# Patient Record
Sex: Female | Born: 1950
Health system: Southern US, Community
[De-identification: ages and names within clinical notes are randomized; demographics above are authoritative.]

---

## 2003-07-02 ENCOUNTER — Other Ambulatory Visit: Admission: RE | Admit: 2003-07-02 | Discharge: 2003-07-02 | Payer: Self-pay | Admitting: Obstetrics and Gynecology

## 2008-04-07 ENCOUNTER — Encounter: Admission: RE | Admit: 2008-04-07 | Discharge: 2008-04-07 | Payer: Self-pay | Admitting: Family Medicine

## 2009-06-15 IMAGING — CR DG LUMBAR SPINE COMPLETE 4+V
5 series · 5 of 5 positions shown · non-contrast
Comparison: None

CLINICAL DATA: Low back pain.  Left leg pain.

LUMBAR SPINE - COMPLETE 4+ VIEW

[view not recorded (1 of 5)]
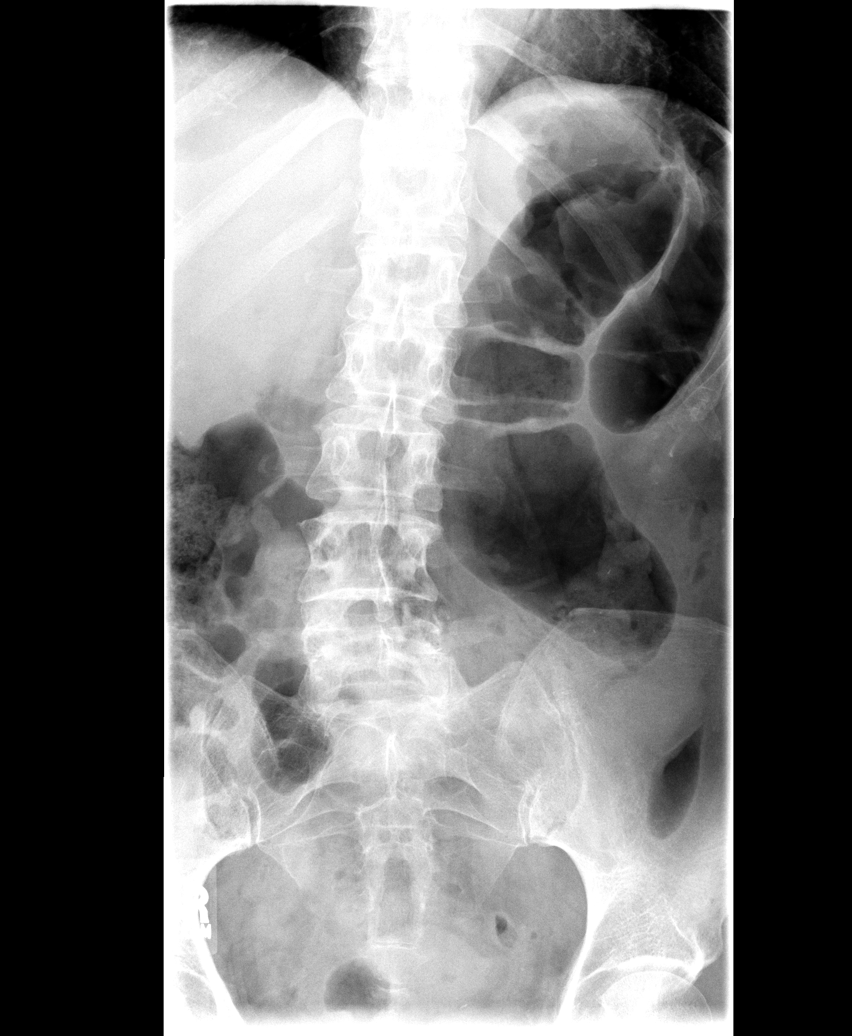

[view not recorded (2 of 5)]
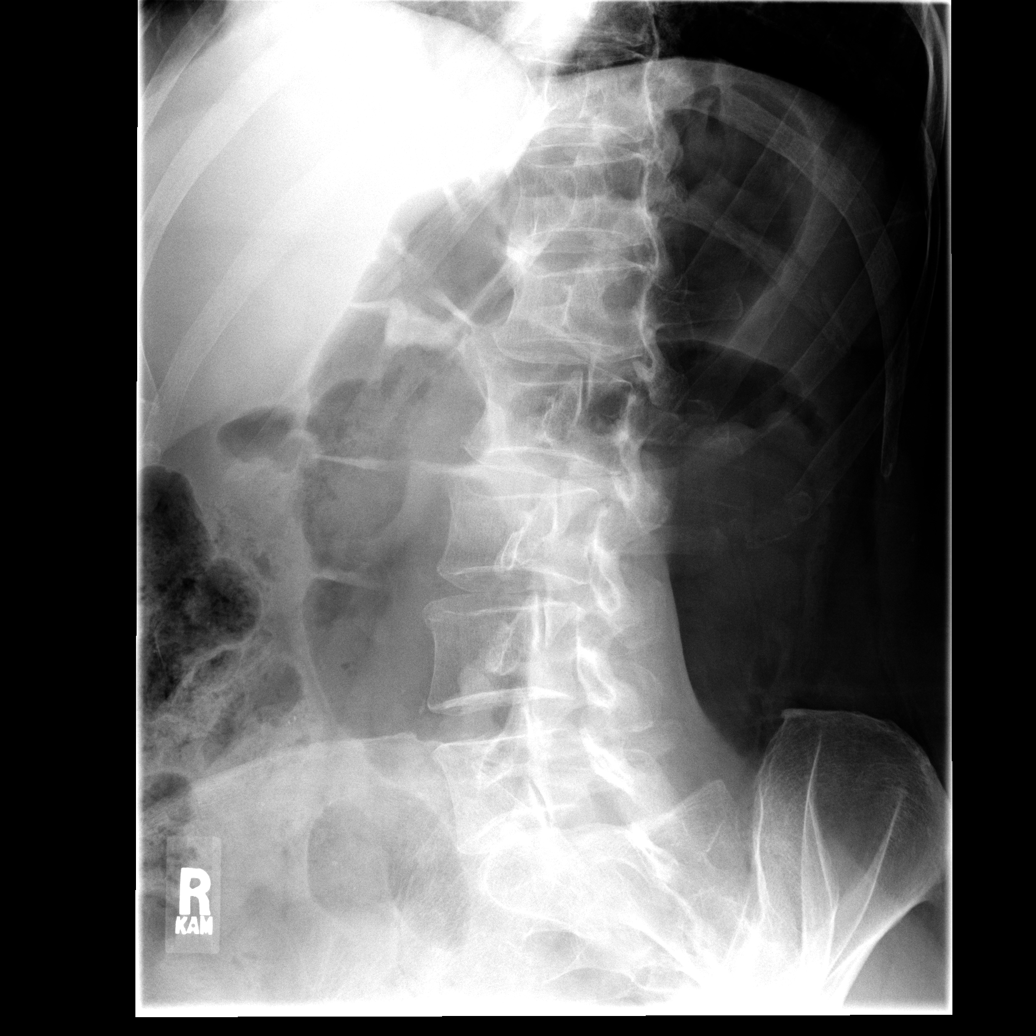

[view not recorded (3 of 5)]
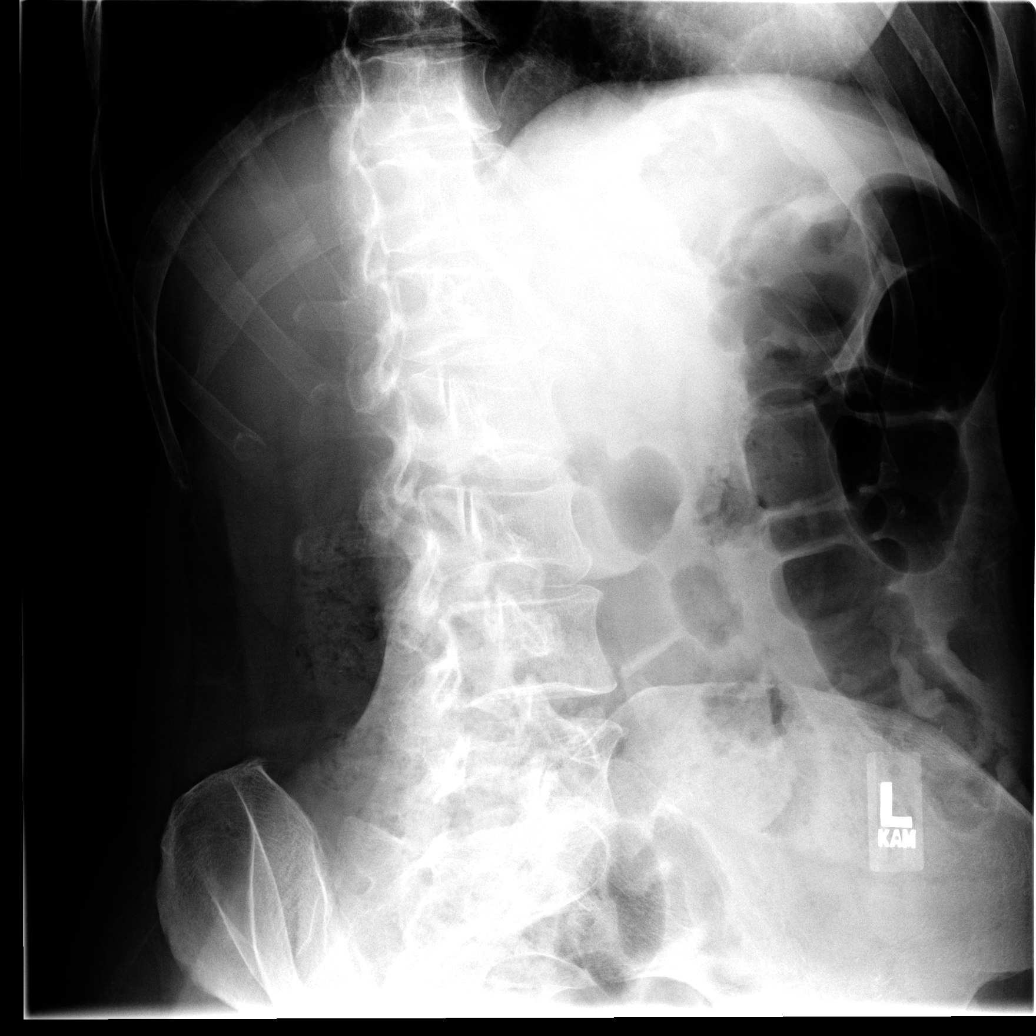

[view not recorded (4 of 5)]
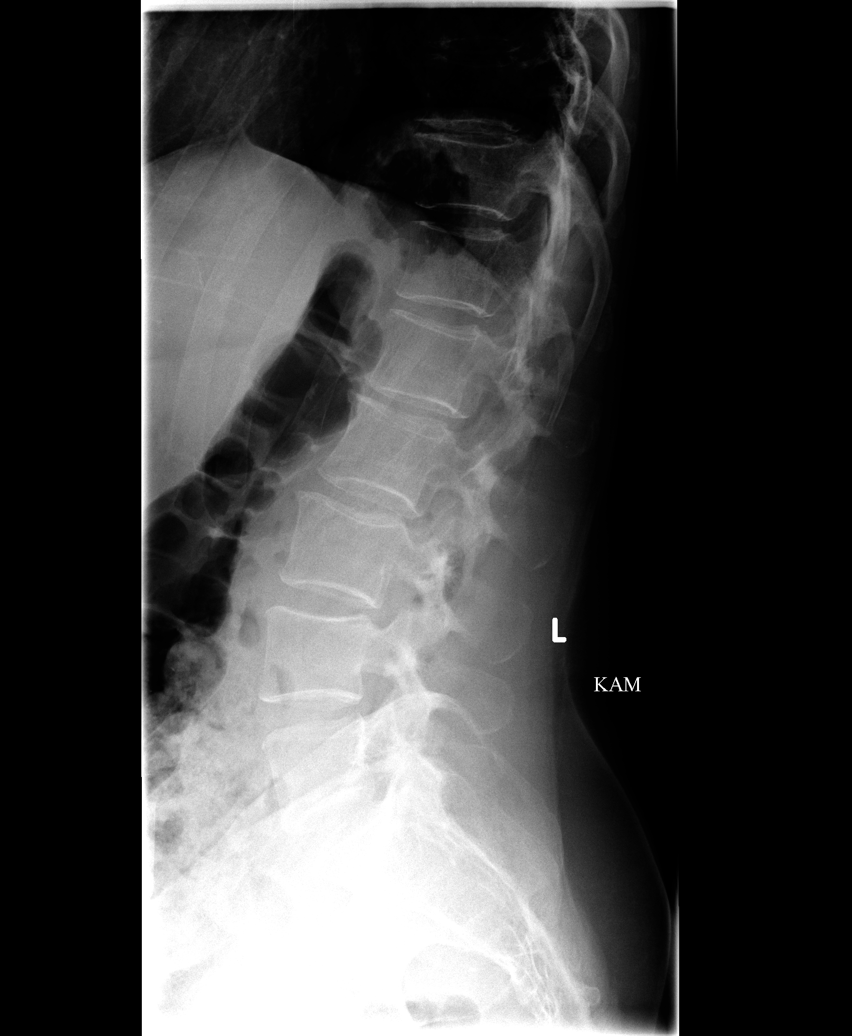

[view not recorded (5 of 5)]
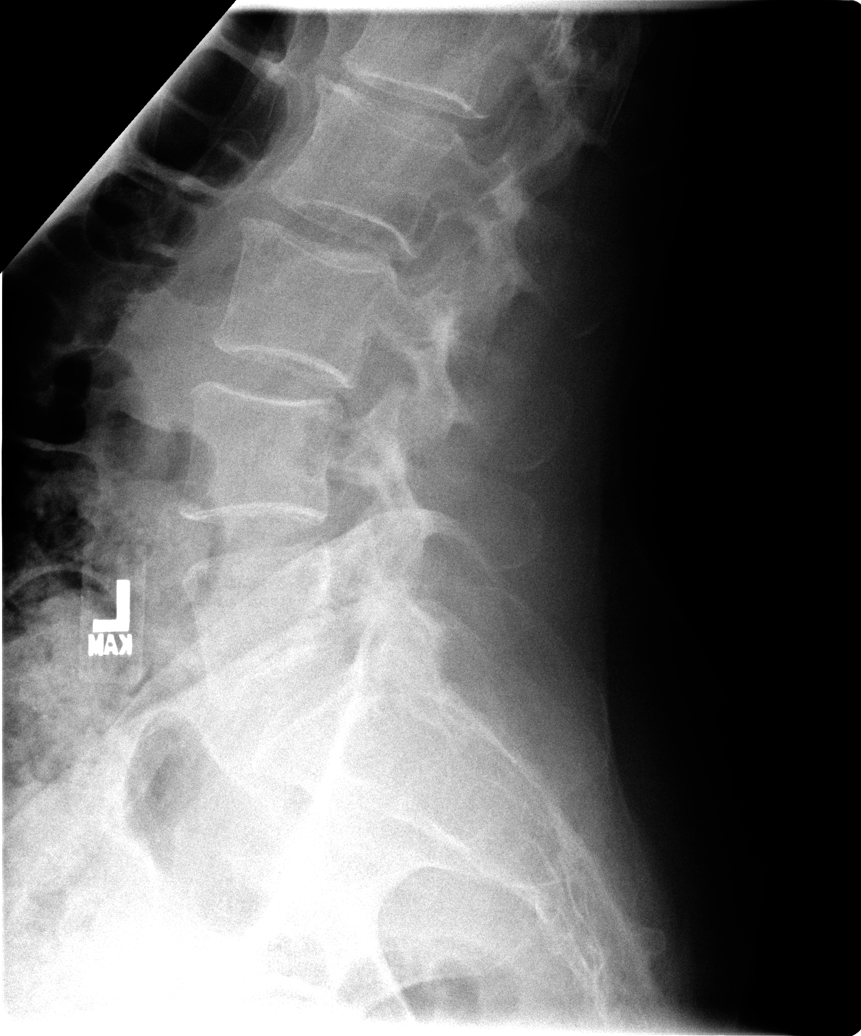

[5 of 5 positions shown; findings below may reference images not displayed]

FINDINGS: There is scoliosis convex to the left.  There is mild
disc space narrowing at L4-5 and L5-S1.  There is facet
degeneration in the lower lumbar spine without anterolisthesis.  No
evidence of fracture or focal lesion.
IMPRESSION: Scoliosis and mild lower lumbar degenerative changes.  Certainly,
there is enough disease that symptoms could result.

## 2018-02-07 DIAGNOSIS — D171 Benign lipomatous neoplasm of skin and subcutaneous tissue of trunk: Secondary | ICD-10-CM | POA: Diagnosis not present

## 2018-02-07 DIAGNOSIS — L570 Actinic keratosis: Secondary | ICD-10-CM | POA: Diagnosis not present

## 2018-02-07 DIAGNOSIS — L821 Other seborrheic keratosis: Secondary | ICD-10-CM | POA: Diagnosis not present

## 2018-02-07 DIAGNOSIS — L814 Other melanin hyperpigmentation: Secondary | ICD-10-CM | POA: Diagnosis not present

## 2018-04-02 DIAGNOSIS — I10 Essential (primary) hypertension: Secondary | ICD-10-CM | POA: Diagnosis not present

## 2018-04-02 DIAGNOSIS — T63441A Toxic effect of venom of bees, accidental (unintentional), initial encounter: Secondary | ICD-10-CM | POA: Diagnosis not present

## 2018-04-02 DIAGNOSIS — E039 Hypothyroidism, unspecified: Secondary | ICD-10-CM | POA: Diagnosis not present

## 2018-04-02 DIAGNOSIS — R21 Rash and other nonspecific skin eruption: Secondary | ICD-10-CM | POA: Diagnosis not present

## 2018-04-02 DIAGNOSIS — E7800 Pure hypercholesterolemia, unspecified: Secondary | ICD-10-CM | POA: Diagnosis not present

## 2018-04-02 DIAGNOSIS — G43909 Migraine, unspecified, not intractable, without status migrainosus: Secondary | ICD-10-CM | POA: Diagnosis not present

## 2018-04-02 DIAGNOSIS — J309 Allergic rhinitis, unspecified: Secondary | ICD-10-CM | POA: Diagnosis not present

## 2018-04-02 DIAGNOSIS — Z1389 Encounter for screening for other disorder: Secondary | ICD-10-CM | POA: Diagnosis not present

## 2018-06-10 DIAGNOSIS — Z0000 Encounter for general adult medical examination without abnormal findings: Secondary | ICD-10-CM | POA: Diagnosis not present

## 2018-06-10 DIAGNOSIS — Z1159 Encounter for screening for other viral diseases: Secondary | ICD-10-CM | POA: Diagnosis not present

## 2018-06-10 DIAGNOSIS — E039 Hypothyroidism, unspecified: Secondary | ICD-10-CM | POA: Diagnosis not present

## 2018-06-10 DIAGNOSIS — Z23 Encounter for immunization: Secondary | ICD-10-CM | POA: Diagnosis not present

## 2018-06-10 DIAGNOSIS — Z1389 Encounter for screening for other disorder: Secondary | ICD-10-CM | POA: Diagnosis not present

## 2018-06-10 DIAGNOSIS — E7800 Pure hypercholesterolemia, unspecified: Secondary | ICD-10-CM | POA: Diagnosis not present

## 2018-06-10 DIAGNOSIS — I10 Essential (primary) hypertension: Secondary | ICD-10-CM | POA: Diagnosis not present

## 2018-06-10 DIAGNOSIS — M8588 Other specified disorders of bone density and structure, other site: Secondary | ICD-10-CM | POA: Diagnosis not present

## 2018-06-10 DIAGNOSIS — G43909 Migraine, unspecified, not intractable, without status migrainosus: Secondary | ICD-10-CM | POA: Diagnosis not present

## 2018-06-18 DIAGNOSIS — H52223 Regular astigmatism, bilateral: Secondary | ICD-10-CM | POA: Diagnosis not present

## 2018-06-18 DIAGNOSIS — H524 Presbyopia: Secondary | ICD-10-CM | POA: Diagnosis not present

## 2018-06-18 DIAGNOSIS — H5203 Hypermetropia, bilateral: Secondary | ICD-10-CM | POA: Diagnosis not present

## 2019-01-28 DIAGNOSIS — I10 Essential (primary) hypertension: Secondary | ICD-10-CM | POA: Diagnosis not present

## 2019-01-28 DIAGNOSIS — E039 Hypothyroidism, unspecified: Secondary | ICD-10-CM | POA: Diagnosis not present

## 2019-01-28 DIAGNOSIS — E7800 Pure hypercholesterolemia, unspecified: Secondary | ICD-10-CM | POA: Diagnosis not present

## 2019-01-28 DIAGNOSIS — J309 Allergic rhinitis, unspecified: Secondary | ICD-10-CM | POA: Diagnosis not present

## 2019-06-16 DIAGNOSIS — E039 Hypothyroidism, unspecified: Secondary | ICD-10-CM | POA: Diagnosis not present

## 2019-06-16 DIAGNOSIS — J309 Allergic rhinitis, unspecified: Secondary | ICD-10-CM | POA: Diagnosis not present

## 2019-06-16 DIAGNOSIS — I10 Essential (primary) hypertension: Secondary | ICD-10-CM | POA: Diagnosis not present

## 2019-06-16 DIAGNOSIS — G43909 Migraine, unspecified, not intractable, without status migrainosus: Secondary | ICD-10-CM | POA: Diagnosis not present

## 2019-06-16 DIAGNOSIS — Z1389 Encounter for screening for other disorder: Secondary | ICD-10-CM | POA: Diagnosis not present

## 2019-06-16 DIAGNOSIS — M8580 Other specified disorders of bone density and structure, unspecified site: Secondary | ICD-10-CM | POA: Diagnosis not present

## 2019-06-16 DIAGNOSIS — E7800 Pure hypercholesterolemia, unspecified: Secondary | ICD-10-CM | POA: Diagnosis not present

## 2019-06-16 DIAGNOSIS — Z0000 Encounter for general adult medical examination without abnormal findings: Secondary | ICD-10-CM | POA: Diagnosis not present

## 2019-06-16 DIAGNOSIS — Z23 Encounter for immunization: Secondary | ICD-10-CM | POA: Diagnosis not present

## 2019-06-17 ENCOUNTER — Other Ambulatory Visit: Payer: Self-pay | Admitting: Internal Medicine

## 2019-06-17 DIAGNOSIS — M8580 Other specified disorders of bone density and structure, unspecified site: Secondary | ICD-10-CM

## 2019-06-17 DIAGNOSIS — Z1231 Encounter for screening mammogram for malignant neoplasm of breast: Secondary | ICD-10-CM

## 2019-06-20 DIAGNOSIS — H5203 Hypermetropia, bilateral: Secondary | ICD-10-CM | POA: Diagnosis not present

## 2019-06-20 DIAGNOSIS — H52223 Regular astigmatism, bilateral: Secondary | ICD-10-CM | POA: Diagnosis not present

## 2019-06-20 DIAGNOSIS — H524 Presbyopia: Secondary | ICD-10-CM | POA: Diagnosis not present

## 2019-07-08 DIAGNOSIS — L821 Other seborrheic keratosis: Secondary | ICD-10-CM | POA: Diagnosis not present

## 2019-07-08 DIAGNOSIS — L430 Hypertrophic lichen planus: Secondary | ICD-10-CM | POA: Diagnosis not present

## 2019-07-08 DIAGNOSIS — D485 Neoplasm of uncertain behavior of skin: Secondary | ICD-10-CM | POA: Diagnosis not present

## 2019-07-08 DIAGNOSIS — D171 Benign lipomatous neoplasm of skin and subcutaneous tissue of trunk: Secondary | ICD-10-CM | POA: Diagnosis not present

## 2019-07-08 DIAGNOSIS — L814 Other melanin hyperpigmentation: Secondary | ICD-10-CM | POA: Diagnosis not present

## 2019-07-08 DIAGNOSIS — D1801 Hemangioma of skin and subcutaneous tissue: Secondary | ICD-10-CM | POA: Diagnosis not present

## 2019-08-19 ENCOUNTER — Encounter: Payer: Self-pay | Admitting: Physician Assistant

## 2019-08-19 ENCOUNTER — Other Ambulatory Visit: Payer: Self-pay

## 2019-08-19 ENCOUNTER — Ambulatory Visit (INDEPENDENT_AMBULATORY_CARE_PROVIDER_SITE_OTHER): Payer: Medicare HMO

## 2019-08-19 ENCOUNTER — Ambulatory Visit: Payer: Medicare HMO | Admitting: Physician Assistant

## 2019-08-19 VITALS — Ht 66.0 in | Wt 138.0 lb

## 2019-08-19 DIAGNOSIS — M7671 Peroneal tendinitis, right leg: Secondary | ICD-10-CM

## 2019-08-19 DIAGNOSIS — M79671 Pain in right foot: Secondary | ICD-10-CM | POA: Diagnosis not present

## 2019-08-19 NOTE — Progress Notes (Signed)
   Office Visit Note   Patient: Jenna Hicks           Date of Birth: [birth year]           MRN: [id number] Visit Date: 08/19/2019              Requested by: No referring provider defined for this encounter. PCP: System, Pcp Not In  Chief Complaint  Patient presents with  . Right Foot - Pain, New Patient (Initial Visit)      HPI: This is a pleasant active 68 year old woman who presents with a 2 to 3-week history of right lateral foot pain she denies any injuries or change in activity she notices the pain in the lateral foot the most when she turns her foot inward.  She denies any significant swelling.  She does walk daily and use supportive shoe wear  Assessment & Plan: Visit Diagnoses:  1. Peroneal tendinitis, right leg   2. Pain in right foot     Plan: The natural history of this problem. She could try a short course of steroids  And I do think she would benefit from a short course of PT focusing on Fascial strengthening. She does not want to take steroids at first but is willing to try a regular course of NSAIDS  Such as Motrin. She will follow up in 2 weeks  Follow-Up Instructions: Return in about 2 years (around 08/18/2021).   Ortho Exam  Patient is alert, oriented, no adenopathy, well-dressed, normal affect, normal respiratory effort. Right Foot: Cavus . No swelling . Good dorsiflexion to 20 degrees past neutral. Pulses intact. She is focally tender to palpation over the distal peroneal tendon. Pain is reproduced with internal rotation. No tenderness in the midfoot or medially  Imaging: Xr Foot Complete Right  Result Date: 08/19/2019 3 Views of her right foot were reviewed today. Overall well maintained alignment. No acute osseus changes. She does have a large os perineum.   No images are attached to the encounter.  Labs: No results found for: HGBA1C, ESRSEDRATE, CRP, LABURIC, REPTSTATUS, GRAMSTAIN, CULT, LABORGA   No results found for: ALBUMIN, PREALBUMIN, LABURIC   No results found for: MG No results found for: VD25OH  No results found for: PREALBUMIN No flowsheet data found.   Body mass index is 22.27 kg/m.  Orders:  Orders Placed This Encounter  Procedures  . XR Foot Complete Right  . Ambulatory referral to Physical Therapy   No orders of the defined types were placed in this encounter.    Procedures: No procedures performed  Clinical Data: No additional findings.  ROS:  All other systems negative, except as noted in the HPI. Review of Systems  Objective: Vital Signs: Ht 5\' 6"  (1.676 m)   Wt 138 lb (62.6 kg)   BMI 22.27 kg/m   Specialty Comments:  No specialty comments available.  PMFS History: There are no active problems to display for this patient.  History reviewed. No pertinent past medical history.  History reviewed. No pertinent family history.  History reviewed. No pertinent surgical history. Social History   Occupational History  . Not on file  Tobacco Use  . Smoking status: Never Smoker  . Smokeless tobacco: Never Used  Substance and Sexual Activity  . Alcohol use: Never    Frequency: Never  . Drug use: Never  . Sexual activity: Not on file

## 2019-09-05 ENCOUNTER — Ambulatory Visit
Admission: RE | Admit: 2019-09-05 | Discharge: 2019-09-05 | Disposition: A | Discharge status: Home / Self Care | Payer: Medicare HMO | Source: Ambulatory Visit | Attending: Internal Medicine | Admitting: Internal Medicine

## 2019-09-05 ENCOUNTER — Other Ambulatory Visit: Payer: Self-pay

## 2019-09-05 ENCOUNTER — Ambulatory Visit: Payer: Medicare HMO | Admitting: Physical Therapy

## 2019-09-05 DIAGNOSIS — Z1231 Encounter for screening mammogram for malignant neoplasm of breast: Secondary | ICD-10-CM

## 2019-09-05 DIAGNOSIS — M85851 Other specified disorders of bone density and structure, right thigh: Secondary | ICD-10-CM | POA: Diagnosis not present

## 2019-09-05 DIAGNOSIS — M8580 Other specified disorders of bone density and structure, unspecified site: Secondary | ICD-10-CM

## 2019-09-05 DIAGNOSIS — Z780 Asymptomatic menopausal state: Secondary | ICD-10-CM | POA: Diagnosis not present

## 2019-09-08 ENCOUNTER — Ambulatory Visit: Payer: Medicare HMO | Admitting: Physician Assistant

## 2019-10-21 ENCOUNTER — Ambulatory Visit: Payer: Medicare HMO

## 2019-11-11 ENCOUNTER — Ambulatory Visit: Payer: Medicare HMO

## 2020-07-07 ENCOUNTER — Encounter: Payer: Self-pay | Admitting: Family

## 2020-07-07 ENCOUNTER — Ambulatory Visit: Payer: Self-pay

## 2020-07-07 ENCOUNTER — Ambulatory Visit: Payer: Medicare HMO | Admitting: Family

## 2020-07-07 VITALS — Ht 66.0 in | Wt 138.0 lb

## 2020-07-07 DIAGNOSIS — M25511 Pain in right shoulder: Secondary | ICD-10-CM | POA: Diagnosis not present

## 2020-07-07 MED ORDER — LIDOCAINE HCL 1 % IJ SOLN
5.0000 mL | INTRAMUSCULAR | Status: AC | PRN
Start: 2020-07-07 — End: 2020-07-07
  Administered 2020-07-07: 5 mL

## 2020-07-07 MED ORDER — METHYLPREDNISOLONE ACETATE 40 MG/ML IJ SUSP
40.0000 mg | INTRAMUSCULAR | Status: AC | PRN
  Administered 2020-07-07: 40 mg via INTRA_ARTICULAR

## 2020-07-07 NOTE — Progress Notes (Signed)
Office Visit Note   Patient: Jenna Hicks           Date of Birth: Mar 09, 1951           MRN: [id number] Visit Date: 07/07/2020              Requested by: No referring provider defined for this encounter. PCP: Pcp, No  Chief Complaint  Patient presents with  . Right Shoulder - Pain      HPI: The patient is a 69 year old woman who presents today complaining of right shoulder pain this is been ongoing since the winter waxes and wanes.  She associates this with loss of range of motion difficulty reaching above head without pain her pain radiates down the anterior aspect of her shoulder and into the biceps.  She feels she has not been getting better does not associate this with a specific event or injury she does have issues with increased pain when she is swimming more frequently.  Assessment & Plan: Visit Diagnoses:  1. Acute pain of right shoulder     Plan: Depo-Medrol injection of the right shoulder today.  Patient tolerated well  Follow-Up Instructions: Return in about 3 weeks (around 07/28/2020).   Right Shoulder Exam   Tenderness  The patient is experiencing tenderness in the biceps tendon.  Range of Motion  The patient has normal right shoulder ROM.  Muscle Strength  The patient has normal right shoulder strength.  Tests  Impingement: positive Drop arm: negative  Other  Sensation: normal Pulse: present      Patient is alert, oriented, no adenopathy, well-dressed, normal affect, normal respiratory effort.   Imaging: XR Shoulder Right  Result Date: 07/07/2020 Radiographs of the right shoulder without acute finding. No superior migration of humeral head.  No images are attached to the encounter.  Labs: No results found for: HGBA1C, ESRSEDRATE, CRP, LABURIC, REPTSTATUS, GRAMSTAIN, CULT, LABORGA   No results found for: ALBUMIN, PREALBUMIN, LABURIC  No results found for: MG No results found for: VD25OH  No results found for: PREALBUMIN No flowsheet  data found.   Body mass index is 22.27 kg/m.  Orders:  Orders Placed This Encounter  Procedures  . Large Joint Inj  . XR Shoulder Right   No orders of the defined types were placed in this encounter.    Procedures: Large Joint Inj: R subacromial bursa on 07/07/2020 3:25 PM Indications: diagnostic evaluation and pain Details: 22 G 1.5 in needle  Arthrogram: No  Medications: 5 mL lidocaine 1 %; 40 mg methylPREDNISolone acetate 40 MG/ML Outcome: tolerated well, no immediate complications Procedure, treatment alternatives, risks and benefits explained, specific risks discussed. Consent was given by the patient. Immediately prior to procedure a time out was called to verify the correct patient, procedure, equipment, support staff and site/side marked as required. Patient was prepped and draped in the usual sterile fashion.      Clinical Data: No additional findings.  ROS:  All other systems negative, except as noted in the HPI. Review of Systems  Constitutional: Negative for chills and fever.  Musculoskeletal: Positive for arthralgias. Negative for joint swelling.    Objective: Vital Signs: Ht 5\' 6"  (1.676 m)   Wt 138 lb (62.6 kg)   BMI 22.27 kg/m   Specialty Comments:  No specialty comments available.  PMFS History: There are no problems to display for this patient.  History reviewed. No pertinent past medical history.  History reviewed. No pertinent family history.  History reviewed. No pertinent surgical  history. Social History   Occupational History  . Not on file  Tobacco Use  . Smoking status: Never Smoker  . Smokeless tobacco: Never Used  Substance and Sexual Activity  . Alcohol use: Never  . Drug use: Never  . Sexual activity: Not on file

## 2020-09-06 ENCOUNTER — Other Ambulatory Visit: Payer: Self-pay | Admitting: Internal Medicine

## 2020-09-06 DIAGNOSIS — Z1231 Encounter for screening mammogram for malignant neoplasm of breast: Secondary | ICD-10-CM

## 2021-02-09 ENCOUNTER — Ambulatory Visit
Admission: RE | Admit: 2021-02-09 | Discharge: 2021-02-09 | Disposition: A | Discharge status: Home / Self Care | Payer: Medicare HMO | Source: Ambulatory Visit | Attending: Internal Medicine | Admitting: Internal Medicine

## 2021-02-09 ENCOUNTER — Other Ambulatory Visit: Payer: Self-pay

## 2021-02-09 DIAGNOSIS — Z1231 Encounter for screening mammogram for malignant neoplasm of breast: Secondary | ICD-10-CM

## 2021-05-13 ENCOUNTER — Ambulatory Visit: Payer: Medicare HMO | Admitting: Surgical

## 2021-05-13 ENCOUNTER — Encounter: Payer: Self-pay | Admitting: Surgical

## 2021-05-13 ENCOUNTER — Other Ambulatory Visit: Payer: Self-pay

## 2021-05-13 VITALS — Ht 66.5 in | Wt 137.0 lb

## 2021-05-13 DIAGNOSIS — M25511 Pain in right shoulder: Secondary | ICD-10-CM | POA: Diagnosis not present

## 2021-05-13 NOTE — Progress Notes (Signed)
Office Visit Note   Patient: Jenna Hicks           Date of Birth: 05-01-1951           MRN: [id number] Visit Date: 05/13/2021 Requested by: No referring provider defined for this encounter. PCP: Pcp, No  Subjective: Chief Complaint  Patient presents with   Right Shoulder - New Patient (Initial Visit)    HPI: Jenna Hicks is a 70 y.o. female who presents to the office complaining of right shoulder pain.  Patient complains of 1.5 years of right shoulder pain without any injury.  Localizes pain to the anterior lateral aspect of the shoulder with some radiation into the bicep.  No radicular pain down the arm, numbness/tingling, neck pain, scapular pain.  No history of prior shoulder surgery or neck surgery.  No history of shoulder instability.  She is an active individual who enjoys swimming freestyle as well as playing pickle ball.  She has difficulty performing freestyle strokes especially going overhead and in front of her.  Pickleball does not really bother her so much.  No real pain with active forward flexion but she does note pain with abduction.  Pain does not wake her up at night and she denies any weakness.  She had injection with Dondra Prader back in October 2021 that was reportedly in the anterior shoulder and she states this provided no significant lasting relief..                ROS: All systems reviewed are negative as they relate to the chief complaint within the history of present illness.  Patient denies fevers or chills.  Assessment & Plan: Visit Diagnoses:  1. Acute pain of right shoulder     Plan: Patient is a 70 year old female who presents complaint of right shoulder pain.  About 1.5 years of symptoms with no injury.  Previous radiographs reviewed and demonstrate no significant findings to explain her pain.  She has excellent range of motion and good rotator cuff strength on exam today but she does have increased pain with supraspinatus and external rotation.  With no  improvement over the last 1-1/2 years and no significant lasting relief from her previous cortisone injection, gave her options including pursuing formal physical therapy with an injection today versus MRI scan for evaluation of possible rotator cuff tear.  She would like to have MRI scan in order to definitively delineate what is causing her pain.  This is reasonable, ordered MRI arthrogram of the right shoulder for further evaluation of rotator cuff tear versus SLAP tear.  Follow-Up Instructions: No follow-ups on file.   Orders:  No orders of the defined types were placed in this encounter.  No orders of the defined types were placed in this encounter.     Procedures: No procedures performed   Clinical Data: No additional findings.  Objective: Vital Signs: Ht 5' 6.5" (1.689 m)   Wt 137 lb (62.1 kg)   BMI 21.78 kg/m   Physical Exam:  Constitutional: Patient appears well-developed HEENT:  Head: Normocephalic Eyes:EOM are normal Neck: Normal range of motion Cardiovascular: Normal rate Pulmonary/chest: Effort normal Neurologic: Patient is alert Skin: Skin is warm Psychiatric: Patient has normal mood and affect  Ortho Exam: Ortho exam demonstrates right shoulder with 80 degrees external rotation, 100 degrees abduction, 180 degrees forward flexion.  This is equivalent to the left shoulder.  She has excellent rotator cuff strength of supra, infra, subscap but she does have increased pain with supraspinatus  and external rotation resistance testing.  No crepitus or grinding noted with passive motion of the shoulder.  No tenderness over the [facility] joint.  Mild tenderness over the bicipital groove.  No tenderness of the axial cervical spine.  Negative Hornblower sign.  Negative belly press test.  +5 motor strength of bilateral grip strength, finger abduction, pronation/supination, bicep, tricep, deltoid.  Specialty Comments:  No specialty comments available.  Imaging: No results  found.   PMFS History: There are no problems to display for this patient.  History reviewed. No pertinent past medical history.  History reviewed. No pertinent family history.  History reviewed. No pertinent surgical history. Social History   Occupational History   Not on file  Tobacco Use   Smoking status: Never   Smokeless tobacco: Never  Substance and Sexual Activity   Alcohol use: Never   Drug use: Never   Sexual activity: Not on file

## 2021-05-16 ENCOUNTER — Other Ambulatory Visit: Payer: Self-pay

## 2021-05-16 DIAGNOSIS — M25511 Pain in right shoulder: Secondary | ICD-10-CM

## 2021-06-07 ENCOUNTER — Other Ambulatory Visit: Payer: Medicare HMO

## 2022-03-08 ENCOUNTER — Ambulatory Visit (INDEPENDENT_AMBULATORY_CARE_PROVIDER_SITE_OTHER): Payer: Medicare HMO

## 2022-03-08 ENCOUNTER — Encounter: Payer: Self-pay | Admitting: Surgical

## 2022-03-08 ENCOUNTER — Ambulatory Visit: Payer: Medicare HMO | Admitting: Surgical

## 2022-03-08 DIAGNOSIS — M25562 Pain in left knee: Secondary | ICD-10-CM

## 2022-03-08 DIAGNOSIS — M1712 Unilateral primary osteoarthritis, left knee: Secondary | ICD-10-CM

## 2022-03-08 MED ORDER — METHYLPREDNISOLONE ACETATE 40 MG/ML IJ SUSP
40.0000 mg | INTRAMUSCULAR | Status: AC | PRN
  Administered 2022-03-08: 40 mg via INTRA_ARTICULAR

## 2022-03-08 MED ORDER — BUPIVACAINE HCL 0.25 % IJ SOLN
4.0000 mL | INTRAMUSCULAR | Status: AC | PRN
  Administered 2022-03-08: 4 mL via INTRA_ARTICULAR

## 2022-03-08 MED ORDER — LIDOCAINE HCL 1 % IJ SOLN
5.0000 mL | INTRAMUSCULAR | Status: AC | PRN
  Administered 2022-03-08: 5 mL

## 2022-03-08 NOTE — Progress Notes (Signed)
Office Visit Note   Patient: Jenna Hicks           Date of Birth: 1951-07-25           MRN: [id number] Visit Date: 03/08/2022 Requested by: No referring provider defined for this encounter. PCP: Pcp, No  Subjective: Chief Complaint  Patient presents with   Left Knee - Pain    HPI: Jenna Hicks is a 71 y.o. female who presents to the office complaining of left knee pain.  Patient notes left knee pain over the last 2 months.  Cannot recall a specific injury.  She is a very active individual and enjoys walking about 3 to 4 miles per day and playing pickle ball.  She states that pain is better with rest and she has been a little less active than normal in the past couple days so feel little better.  Localizes most the pain to the lateral aspect of the left knee.  No significant swelling, instability, waking with pain.  Occasional catching sensation in the lateral aspect of the knee.  No history of prior left knee surgery.  Pain will occasionally radiate up the lateral thigh little bit.  Pain is worse with flexion.  No groin pain.  She spends her winters down in [place] and has recently returned to [place] for the summer..                ROS: All systems reviewed are negative as they relate to the chief complaint within the history of present illness.  Patient denies fevers or chills.  Assessment & Plan: Visit Diagnoses:  1. Unilateral primary osteoarthritis, left knee   2. Left knee pain, unspecified chronicity     Plan: Patient is a 71 year old female who presents for evaluation of left knee pain.  She has left knee pain that has been flared up in the last 2 months without injury.  On left knee radiographs taken today, she does have some primarily medial and patellofemoral compartment osteoarthritis that is more mild with preserved joint space but osteophyte formation.  Lateral compartment actually looks very well-preserved but this is where she is having most of her pain with lateral joint  line tenderness today.  She may have more arthritis than meets the eye based on her radiographs and with her continued symptoms, she would like to try left knee injection today.  Left knee was aspirated of 3 cc of nonpurulent synovial fluid prior to injection with cortisone.  Tolerated the procedure well.  Follow-up with the office as needed if pain does not improve in the next 4 to 6 weeks.  Patient also reports that her right shoulder pain that she was seen for in 2022 has resolved.  Follow-Up Instructions: No follow-ups on file.   Orders:  Orders Placed This Encounter  Procedures   XR KNEE 3 VIEW LEFT   No orders of the defined types were placed in this encounter.     Procedures: Large Joint Inj: L knee on 03/08/2022 11:45 AM Indications: diagnostic evaluation, joint swelling and pain Details: 18 G 1.5 in needle, superolateral approach  Arthrogram: No  Medications: 5 mL lidocaine 1 %; 40 mg methylPREDNISolone acetate 40 MG/ML; 4 mL bupivacaine 0.25 % Aspirate: 3 mL Outcome: tolerated well, no immediate complications Procedure, treatment alternatives, risks and benefits explained, specific risks discussed. Consent was given by the patient. Immediately prior to procedure a time out was called to verify the correct patient, procedure, equipment, support staff and site/side marked as  required. Patient was prepped and draped in the usual sterile fashion.       Clinical Data: No additional findings.  Objective: Vital Signs: There were no vitals taken for this visit.  Physical Exam:  Constitutional: Patient appears well-developed HEENT:  Head: Normocephalic Eyes:EOM are normal Neck: Normal range of motion Cardiovascular: Normal rate Pulmonary/chest: Effort normal Neurologic: Patient is alert Skin: Skin is warm Psychiatric: Patient has normal mood and affect  Ortho Exam: Ortho exam demonstrates left knee with trace effusion.  Tenderness over the lateral joint line  moderately with minimal tenderness over the medial joint line.  No calf tenderness.  Negative Homans' sign no pain with hip range of motion.  Excellent quad strength rated 5/5.  No significant instability to anterior posterior drawer.  She has a little bit of clicking sensation over the lateral femoral condyle which may represent the IT band snapping over the condyle.  Little bit of tenderness over the iliotibial band bursal region.  Negative Ober sign.  Specialty Comments:  No specialty comments available.  Imaging: No results found.   PMFS History: There are no problems to display for this patient.  No past medical history on file.  No family history on file.  No past surgical history on file. Social History   Occupational History   Not on file  Tobacco Use   Smoking status: Never   Smokeless tobacco: Never  Substance and Sexual Activity   Alcohol use: Never   Drug use: Never   Sexual activity: Not on file

## 2022-03-30 ENCOUNTER — Other Ambulatory Visit: Payer: Self-pay

## 2022-03-30 DIAGNOSIS — M25562 Pain in left knee: Secondary | ICD-10-CM

## 2022-03-31 ENCOUNTER — Ambulatory Visit: Payer: Medicare HMO | Admitting: Surgical

## 2022-04-04 ENCOUNTER — Other Ambulatory Visit: Payer: Self-pay | Admitting: Internal Medicine

## 2022-04-04 DIAGNOSIS — Z1231 Encounter for screening mammogram for malignant neoplasm of breast: Secondary | ICD-10-CM

## 2022-04-16 ENCOUNTER — Ambulatory Visit
Admission: RE | Admit: 2022-04-16 | Discharge: 2022-04-16 | Disposition: A | Discharge status: Home / Self Care | Payer: Medicare HMO | Source: Ambulatory Visit | Attending: Surgical | Admitting: Surgical

## 2022-04-16 DIAGNOSIS — M25562 Pain in left knee: Secondary | ICD-10-CM

## 2022-04-19 ENCOUNTER — Ambulatory Visit: Payer: Medicare HMO | Admitting: Orthopedic Surgery

## 2022-04-19 DIAGNOSIS — M1712 Unilateral primary osteoarthritis, left knee: Secondary | ICD-10-CM

## 2022-04-19 DIAGNOSIS — M25562 Pain in left knee: Secondary | ICD-10-CM

## 2022-04-22 ENCOUNTER — Encounter: Payer: Self-pay | Admitting: Orthopedic Surgery

## 2022-04-22 NOTE — Progress Notes (Signed)
   Office Visit Note   Patient: Jenna Hicks           Date of Birth: 06/14/1951           MRN: [id number] Visit Date: 04/19/2022 Requested by: No referring provider defined for this encounter. PCP: Pcp, No  Subjective: Chief Complaint  Patient presents with   Other     Scan review    HPI: [name] is a 71 year old active patient with left knee pain.  Having primarily lateral sided pain.  Since she was last seen she has had an MRI scan which is reviewed.  She does have complex tearing of the posterior horn of the medial meniscus with some peripheral meniscal extrusion but no unstable flaps.  She also has high-grade partial-thickness cartilage loss with areas of full-thickness cartilage loss on the medial side.  Laterally the knee looks pretty reasonable.  Patellofemoral joint also involved with arthritis.  No evidence of iliotibial band syndrome on the scan.              ROS: All systems reviewed are negative as they relate to the chief complaint within the history of present illness.  Patient denies  fevers or chills.   Assessment & Plan: Visit Diagnoses:  1. Left knee pain, unspecified chronicity   2. Unilateral primary osteoarthritis, left knee     Plan: Impression is left knee pain which is primarily lateral.  Not too much structural pathology in the knee on that side.  No effusion today.  Based on absence of mechanical symptoms I think it is okay for [name] to gradually return to pickleball if she wants to.  She may not be able to play as much she wants with that arthritis present.  If she starts developing mechanical symptoms then arthroscopic debridement could be considered but even that in the face of significant medial compartment arthritis would be somewhat risky in terms of predictability of outcome.  Follow-up as needed.  Follow-Up Instructions: No follow-ups on file.   Orders:  No orders of the defined types were placed in this encounter.  No orders of the defined types were  placed in this encounter.     Procedures: No procedures performed   Clinical Data: No additional findings.  Objective: Vital Signs: There were no vitals taken for this visit.  Physical Exam:   Constitutional: Patient appears well-developed HEENT:  Head: Normocephalic Eyes:EOM are normal Neck: Normal range of motion Cardiovascular: Normal rate Pulmonary/chest: Effort normal Neurologic: Patient is alert Skin: Skin is warm Psychiatric: Patient has normal mood and affect   Ortho Exam: Ortho exam demonstrates normal gait and alignment.  She has no medial joint line tenderness on the left but there is some lateral tenderness over the iliotibial band and lateral femoral condyle.  Collateral and cruciate ligaments are stable.  No effusion.  Pedal pulses palpable.  Specialty Comments:  No specialty comments available.  Imaging: No results found.   PMFS History: There are no problems to display for this patient.  No past medical history on file.  No family history on file.  No past surgical history on file. Social History   Occupational History   Not on file  Tobacco Use   Smoking status: Never   Smokeless tobacco: Never  Substance and Sexual Activity   Alcohol use: Never   Drug use: Never   Sexual activity: Not on file

## 2022-05-02 ENCOUNTER — Ambulatory Visit: Payer: Medicare HMO

## 2022-05-03 ENCOUNTER — Ambulatory Visit
Admission: RE | Admit: 2022-05-03 | Discharge: 2022-05-03 | Disposition: A | Discharge status: Home / Self Care | Payer: Medicare HMO | Source: Ambulatory Visit | Attending: Internal Medicine | Admitting: Internal Medicine

## 2022-05-03 DIAGNOSIS — Z1231 Encounter for screening mammogram for malignant neoplasm of breast: Secondary | ICD-10-CM
